# Patient Record
Sex: Female | Born: 1975 | Race: White | Hispanic: No | Marital: Married | Smoking: Never smoker
Health system: Southern US, Community
[De-identification: ages and names within clinical notes are randomized; demographics above are authoritative.]

## PROBLEM LIST (undated history)

## (undated) DIAGNOSIS — E785 Hyperlipidemia, unspecified: Secondary | ICD-10-CM

## (undated) HISTORY — PX: TUBAL LIGATION: SHX77

## (undated) HISTORY — DX: Hyperlipidemia, unspecified: E78.5

---

## 2002-10-09 DIAGNOSIS — O24419 Gestational diabetes mellitus in pregnancy, unspecified control: Secondary | ICD-10-CM

## 2004-07-24 ENCOUNTER — Other Ambulatory Visit: Payer: Self-pay

## 2004-07-24 ENCOUNTER — Emergency Department: Payer: Self-pay | Admitting: Emergency Medicine

## 2004-07-24 ENCOUNTER — Observation Stay: Payer: Self-pay | Admitting: Internal Medicine

## 2004-07-27 ENCOUNTER — Ambulatory Visit: Payer: Self-pay | Admitting: Unknown Physician Specialty

## 2006-10-09 DIAGNOSIS — O24419 Gestational diabetes mellitus in pregnancy, unspecified control: Secondary | ICD-10-CM

## 2006-12-17 ENCOUNTER — Encounter: Payer: Self-pay | Admitting: Obstetrics and Gynecology

## 2006-12-24 ENCOUNTER — Encounter: Payer: Self-pay | Admitting: Maternal & Fetal Medicine

## 2007-06-28 ENCOUNTER — Ambulatory Visit: Payer: Self-pay | Admitting: Obstetrics and Gynecology

## 2007-07-01 ENCOUNTER — Inpatient Hospital Stay: Payer: Self-pay | Admitting: Obstetrics and Gynecology

## 2009-02-01 ENCOUNTER — Ambulatory Visit: Payer: Self-pay | Admitting: Obstetrics and Gynecology

## 2011-01-11 ENCOUNTER — Ambulatory Visit: Payer: Self-pay | Admitting: Internal Medicine

## 2016-01-20 ENCOUNTER — Encounter: Payer: Self-pay | Admitting: Obstetrics and Gynecology

## 2016-01-25 ENCOUNTER — Encounter: Payer: Self-pay | Admitting: Obstetrics and Gynecology

## 2016-01-25 ENCOUNTER — Ambulatory Visit (INDEPENDENT_AMBULATORY_CARE_PROVIDER_SITE_OTHER): Payer: BLUE CROSS/BLUE SHIELD | Admitting: Obstetrics and Gynecology

## 2016-01-25 VITALS — BP 136/84 | HR 83 | Ht 66.0 in | Wt 191.7 lb

## 2016-01-25 DIAGNOSIS — Z1239 Encounter for other screening for malignant neoplasm of breast: Secondary | ICD-10-CM

## 2016-01-25 DIAGNOSIS — Z01419 Encounter for gynecological examination (general) (routine) without abnormal findings: Secondary | ICD-10-CM | POA: Diagnosis not present

## 2016-01-25 DIAGNOSIS — R638 Other symptoms and signs concerning food and fluid intake: Secondary | ICD-10-CM

## 2016-01-25 DIAGNOSIS — Z98891 History of uterine scar from previous surgery: Secondary | ICD-10-CM

## 2016-01-25 DIAGNOSIS — N393 Stress incontinence (female) (male): Secondary | ICD-10-CM

## 2016-01-25 DIAGNOSIS — K5190 Ulcerative colitis, unspecified, without complications: Secondary | ICD-10-CM | POA: Diagnosis not present

## 2016-01-25 NOTE — Progress Notes (Signed)
Patient ID: Autumn Coffey, female   DOB: 1976-05-12, 40 y.o.   MRN: [id number] ANNUAL PREVENTATIVE CARE GYN  ENCOUNTER NOTE  Subjective:       Autumn Coffey is a 40 y.o. [id number] female here for a routine annual gynecologic exam.  Current complaints: 1.  none   No recent health care visits. Significant past history is notable for first pregnancy complicated by fourth degree tear. Subsequent deliveries were by elective C-section in order to avoid further perineal trauma. Patient reports normal bowel and bladder function. Patient is experiencing mild stress urinary incontinence where she occasionally will wear pads    Gynecologic History Patient's last menstrual period was 12/28/2015 (approximate). Contraception: tubal ligation Last Pap: 2014. Results were: normal Last mammogram: 2010. Results were: normal  Obstetric History OB History  Gravida Para Term Preterm AB SAB TAB Ectopic Multiple Living  # Outcome Date GA Lbr Len/2nd Weight Sex Delivery Anes PTL Lv  3 Term 2008   7 lb 2.1 oz (3.234 kg) M CS-LTranv   Y     Complications: GDM (gestational diabetes mellitus)  2 Term 2004   7 lb 1.9 oz (3.23 kg) M CS-LTranv   Y     Complications: GDM (gestational diabetes mellitus)  1 Term 2000   7 lb 2.1 oz (3.234 kg) M Vag-Spont   Y      Past Medical History  Diagnosis Date  . Hyperlipemia     Past Surgical History  Procedure Laterality Date  . Cesarean section    . Tubal ligation      No current outpatient prescriptions on file prior to visit.   No current facility-administered medications on file prior to visit.    No Known Allergies  Social History   Social History  . Marital Status: Married    Spouse Name: N/A  . Number of Children: N/A  . Years of Education: N/A   Occupational History  . Not on file.   Social History Main Topics  . Smoking status: Never Smoker   . Smokeless tobacco: Not on file  . Alcohol Use: No  . Drug Use: No  .  Sexual Activity: Yes    Birth Control/ Protection: Surgical   Other Topics Concern  . Not on file   Social History Narrative  . No narrative on file    Family History  Problem Relation Age of Onset  . Diabetes Mother   . Diabetes Father   . Diabetes Brother   . Cancer Neg Hx   . Heart disease Neg Hx     The following portions of the patient's history were reviewed and updated as appropriate: allergies, current medications, past family history, past medical history, past social history, past surgical history and problem list.  Review of Systems ROS Review of Systems - General ROS: negative for - chills, fatigue, fever, hot flashes, night sweats, weight gain or weight loss Psychological ROS: negative for - anxiety, decreased libido, depression, mood swings, physical abuse or sexual abuse Ophthalmic ROS: negative for - blurry vision, eye pain or loss of vision ENT ROS: negative for - headaches, hearing change, visual changes or vocal changes Allergy and Immunology ROS: negative for - hives, itchy/watery eyes or seasonal allergies Hematological and Lymphatic ROS: negative for - bleeding problems, bruising, swollen lymph nodes or weight loss Endocrine ROS: negative for - galactorrhea, hair pattern changes, hot flashes, malaise/lethargy, mood swings, palpitations, polydipsia/polyuria, skin  changes, temperature intolerance or unexpected weight changes Breast ROS: negative for - new or changing breast lumps or nipple discharge Respiratory ROS: negative for - cough or shortness of breath Cardiovascular ROS: negative for - chest pain, irregular heartbeat, palpitations or shortness of breath Gastrointestinal ROS: no abdominal pain, change in bowel habits, or black or bloody stools Genito-Urinary ROS: no dysuria, trouble voiding, or hematuria Musculoskeletal ROS: negative for - joint pain or joint stiffness Neurological ROS: negative for - bowel and bladder control changes Dermatological  ROS: negative for rash and skin lesion changes   Objective:   BP 136/84 mmHg  Pulse 83  Ht 5\' 6"  (1.676 m)  Wt 191 lb 11.2 oz (86.955 kg)  BMI 30.96 kg/m2  LMP 12/28/2015 (Approximate) CONSTITUTIONAL: Well-developed, well-nourished female in no acute distress.  PSYCHIATRIC: Normal mood and affect. Normal behavior. Normal judgment and thought content. NEUROLGIC: Alert and oriented to person, place, and time. Normal muscle tone coordination. No cranial nerve deficit noted. HENT:  Normocephalic, atraumatic, External right and left ear normal. Oropharynx is clear and moist EYES: Conjunctivae and EOM are normal. Pupils are equal, round, and reactive to light. No scleral icterus.  NECK: Normal range of motion, supple, no masses.  Normal thyroid.  SKIN: Skin is warm and dry. No rash noted. Not diaphoretic. No erythema. No pallor. CARDIOVASCULAR: Normal heart rate noted, regular rhythm, no murmur. RESPIRATORY: Clear to auscultation bilaterally. Effort and breath sounds normal, no problems with respiration noted. BREASTS: Symmetric in size. No masses, skin changes, nipple drainage, or lymphadenopathy. ABDOMEN: Soft, normal bowel sounds, no distention noted.  No tenderness, rebound or guarding. Well-healed Pfannenstiel incisions BLADDER: Normal PELVIC:  External Genitalia: Normal  BUS: Normal  Vagina: Normal estrogen effect  Cervix: Normal; no cervical motion tenderness  Uterus: Normal; midplane, mobile, nontender, normal size and shape  Adnexa: Normal  RV: Rectal sphincter has good tone but is thinned, External Exam NormaI and No Rectal Masses  MUSCULOSKELETAL: Normal range of motion. No tenderness.  No cyanosis, clubbing, or edema.  2+ distal pulses. LYMPHATIC: No Axillary, Supraclavicular, or Inguinal Adenopathy.    Assessment:   Annual gynecologic examination 40 y.o. Contraception: tubal ligation bmi-30 SUI, mild, not desiring intervention  Plan:  Pap: Pap Co Test Mammogram:  Ordered Stool Guaiac Testing:  Not Indicated Labs: vit d lipid a1c fbs lipid Routine preventative health maintenance measures emphasized: Exercise/Diet/Weight control, Tobacco Warnings and Alcohol/Substance use risks Timed voiding, Kegel exercises, encouraged to minimize stress incontinence. Physical therapy consultation suggested Return to Clinic - 1 [address]   Crystal Goose Creek LakeMiller, [place]  Herold HarmsMartin A Brand Siever, MD  Note: This dictation was prepared with Dragon dictation along with smaller phrase technology. Any transcriptional errors that result from this process are unintentional.

## 2016-01-25 NOTE — Patient Instructions (Signed)
1. Pap smear is done. 2. Mammogram is ordered. 3. Continue with healthy eating and exercise with weight loss goal of 10 pounds in 1 year 4. Continue self breast exam monthly 5. Return in 1 year for annual exam

## 2016-01-26 DIAGNOSIS — N393 Stress incontinence (female) (male): Secondary | ICD-10-CM | POA: Insufficient documentation

## 2016-01-28 LAB — PAP IG AND HPV HIGH-RISK
HPV, HIGH-RISK: NEGATIVE
PAP SMEAR COMMENT: 0

## 2016-01-31 ENCOUNTER — Other Ambulatory Visit: Payer: BLUE CROSS/BLUE SHIELD

## 2016-01-31 ENCOUNTER — Ambulatory Visit
Admission: RE | Admit: 2016-01-31 | Discharge: 2016-01-31 | Disposition: A | Discharge status: Home / Self Care | Payer: BLUE CROSS/BLUE SHIELD | Source: Ambulatory Visit | Attending: Obstetrics and Gynecology | Admitting: Obstetrics and Gynecology

## 2016-01-31 DIAGNOSIS — Z1231 Encounter for screening mammogram for malignant neoplasm of breast: Secondary | ICD-10-CM | POA: Diagnosis present

## 2016-01-31 DIAGNOSIS — Z1239 Encounter for other screening for malignant neoplasm of breast: Secondary | ICD-10-CM

## 2016-02-01 LAB — VITAMIN D 25 HYDROXY (VIT D DEFICIENCY, FRACTURES): Vit D, 25-Hydroxy: 19.3 ng/mL — ABNORMAL LOW (ref 30.0–100.0)

## 2016-02-01 LAB — LIPID PANEL
CHOL/HDL RATIO: 6 ratio — AB (ref 0.0–4.4)
CHOLESTEROL TOTAL: 241 mg/dL — AB (ref 100–199)
HDL: 40 mg/dL (ref >39)
LDL CALC: 171 mg/dL — AB (ref 0–99)
TRIGLYCERIDES: 148 mg/dL (ref 0–149)
VLDL CHOLESTEROL CAL: 30 mg/dL (ref 5–40)

## 2016-02-01 LAB — GLUCOSE, RANDOM: GLUCOSE: 125 mg/dL — AB (ref 65–99)

## 2016-02-01 LAB — TSH: TSH: 3.94 u[IU]/mL (ref 0.450–4.500)

## 2016-02-01 LAB — HEMOGLOBIN A1C
Est. average glucose Bld gHb Est-mCnc: 140 mg/dL
Hgb A1c MFr Bld: 6.5 % — ABNORMAL HIGH (ref 4.8–5.6)

## 2016-02-03 ENCOUNTER — Telehealth: Payer: Self-pay

## 2016-02-03 DIAGNOSIS — E119 Type 2 diabetes mellitus without complications: Secondary | ICD-10-CM

## 2016-02-03 DIAGNOSIS — E785 Hyperlipidemia, unspecified: Secondary | ICD-10-CM

## 2016-02-03 NOTE — Telephone Encounter (Signed)
-----   Message from Herold HarmsMartin A Defrancesco, MD sent at 02/02/2016  8:45 AM EDT ----- Please notify - Abnormal Labs Fasting blood sugar and hemoglobin A1c are consistent with type 2 diabetes mellitus. Lipid panel is notable for elevated cholesterol, LDL cholesterol, and triglycerides. Recommended referral to internal medicine for further management planning and intervention Vitamin D level is low; recommend 1000 units vitamin D daily TSH is normal

## 2016-02-03 NOTE — Telephone Encounter (Signed)
Pt aware. Pt did not have pcp. Will refer.

## 2017-01-30 ENCOUNTER — Encounter: Payer: BLUE CROSS/BLUE SHIELD | Admitting: Obstetrics and Gynecology

## 2017-11-28 ENCOUNTER — Other Ambulatory Visit: Payer: Self-pay | Admitting: Nurse Practitioner

## 2017-11-28 DIAGNOSIS — Z1231 Encounter for screening mammogram for malignant neoplasm of breast: Secondary | ICD-10-CM

## 2017-12-27 ENCOUNTER — Ambulatory Visit
Admission: RE | Admit: 2017-12-27 | Discharge: 2017-12-27 | Disposition: A | Discharge status: Home / Self Care | Payer: BC Managed Care – PPO | Source: Ambulatory Visit | Attending: Nurse Practitioner | Admitting: Nurse Practitioner

## 2017-12-27 DIAGNOSIS — Z1231 Encounter for screening mammogram for malignant neoplasm of breast: Secondary | ICD-10-CM | POA: Diagnosis present

## 2019-06-18 ENCOUNTER — Other Ambulatory Visit: Payer: Self-pay | Admitting: Nurse Practitioner

## 2019-06-18 DIAGNOSIS — Z1231 Encounter for screening mammogram for malignant neoplasm of breast: Secondary | ICD-10-CM

## 2019-07-23 ENCOUNTER — Ambulatory Visit
Admission: RE | Admit: 2019-07-23 | Discharge: 2019-07-23 | Disposition: A | Discharge status: Home / Self Care | Payer: BC Managed Care – PPO | Source: Ambulatory Visit | Attending: Nurse Practitioner | Admitting: Nurse Practitioner

## 2019-07-23 DIAGNOSIS — Z1231 Encounter for screening mammogram for malignant neoplasm of breast: Secondary | ICD-10-CM | POA: Diagnosis present

## 2020-07-06 ENCOUNTER — Other Ambulatory Visit: Payer: Self-pay | Admitting: Nurse Practitioner

## 2020-07-06 DIAGNOSIS — Z1231 Encounter for screening mammogram for malignant neoplasm of breast: Secondary | ICD-10-CM

## 2020-07-26 ENCOUNTER — Ambulatory Visit
Admission: RE | Admit: 2020-07-26 | Discharge: 2020-07-26 | Disposition: A | Discharge status: Home / Self Care | Payer: BC Managed Care – PPO | Source: Ambulatory Visit | Attending: Nurse Practitioner | Admitting: Nurse Practitioner

## 2020-07-26 ENCOUNTER — Other Ambulatory Visit: Payer: Self-pay

## 2020-07-26 DIAGNOSIS — Z1231 Encounter for screening mammogram for malignant neoplasm of breast: Secondary | ICD-10-CM

## 2020-08-03 ENCOUNTER — Other Ambulatory Visit: Payer: Self-pay | Admitting: Nurse Practitioner

## 2020-08-03 DIAGNOSIS — N6489 Other specified disorders of breast: Secondary | ICD-10-CM

## 2020-08-03 DIAGNOSIS — R928 Other abnormal and inconclusive findings on diagnostic imaging of breast: Secondary | ICD-10-CM

## 2020-08-04 ENCOUNTER — Ambulatory Visit
Admission: RE | Admit: 2020-08-04 | Discharge: 2020-08-04 | Disposition: A | Discharge status: Home / Self Care | Payer: BC Managed Care – PPO | Source: Ambulatory Visit | Attending: Nurse Practitioner | Admitting: Nurse Practitioner

## 2020-08-04 ENCOUNTER — Other Ambulatory Visit: Payer: Self-pay

## 2020-08-04 DIAGNOSIS — R928 Other abnormal and inconclusive findings on diagnostic imaging of breast: Secondary | ICD-10-CM

## 2020-08-04 DIAGNOSIS — N6489 Other specified disorders of breast: Secondary | ICD-10-CM

## 2021-07-15 ENCOUNTER — Other Ambulatory Visit: Payer: Self-pay | Admitting: Nurse Practitioner

## 2021-07-15 DIAGNOSIS — Z1231 Encounter for screening mammogram for malignant neoplasm of breast: Secondary | ICD-10-CM

## 2021-08-18 ENCOUNTER — Other Ambulatory Visit: Payer: Self-pay

## 2021-08-18 ENCOUNTER — Ambulatory Visit
Admission: RE | Admit: 2021-08-18 | Discharge: 2021-08-18 | Disposition: A | Discharge status: Home / Self Care | Payer: BC Managed Care – PPO | Source: Ambulatory Visit | Attending: Nurse Practitioner | Admitting: Nurse Practitioner

## 2021-08-18 DIAGNOSIS — Z1231 Encounter for screening mammogram for malignant neoplasm of breast: Secondary | ICD-10-CM | POA: Diagnosis present

## 2022-03-19 IMAGING — MG MM DIGITAL SCREENING BILAT W/ TOMO AND CAD
6 of 10 series · 6 of 30 positions shown · non-contrast
Comparison: Previous exam(s).

CLINICAL DATA: Screening.

EXAM:
DIGITAL SCREENING BILATERAL MAMMOGRAM WITH TOMOSYNTHESIS AND CAD
TECHNIQUE: Bilateral screening digital craniocaudal and mediolateral oblique
mammograms were obtained. Bilateral screening digital breast
tomosynthesis was performed. The images were evaluated with
computer-aided detection.

[R CC synth-2D (1 of 2)]
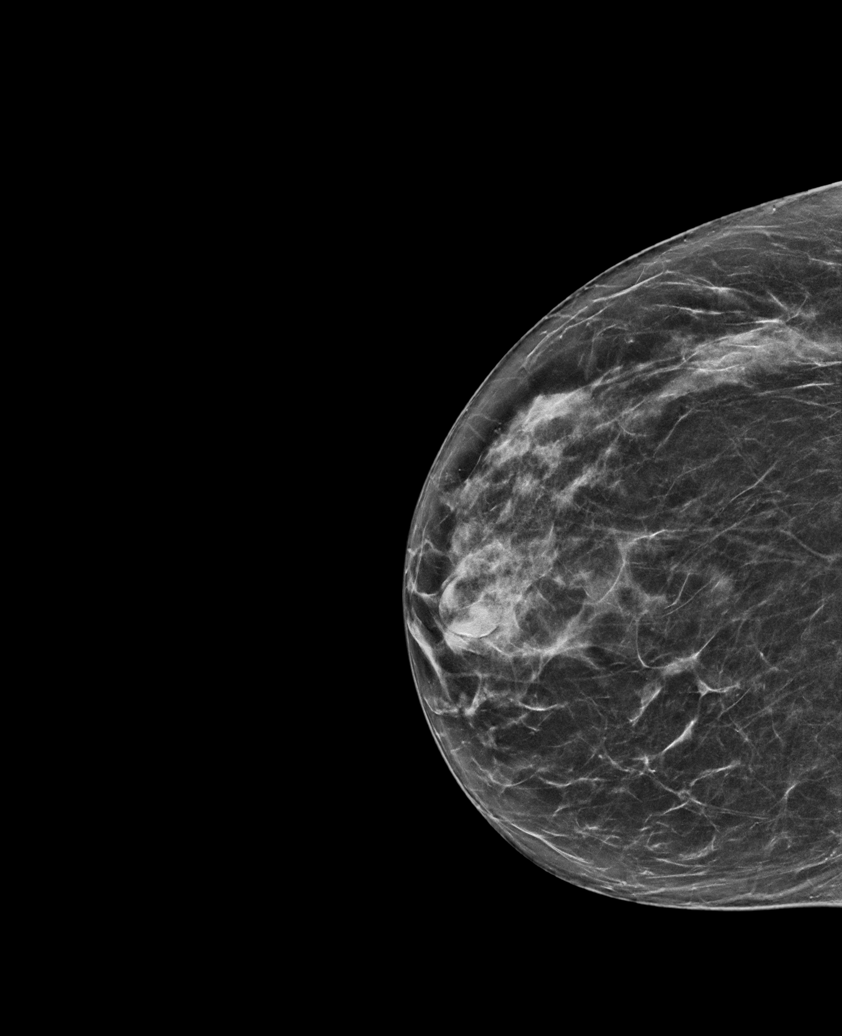

[R CC synth-2D (2 of 2)]
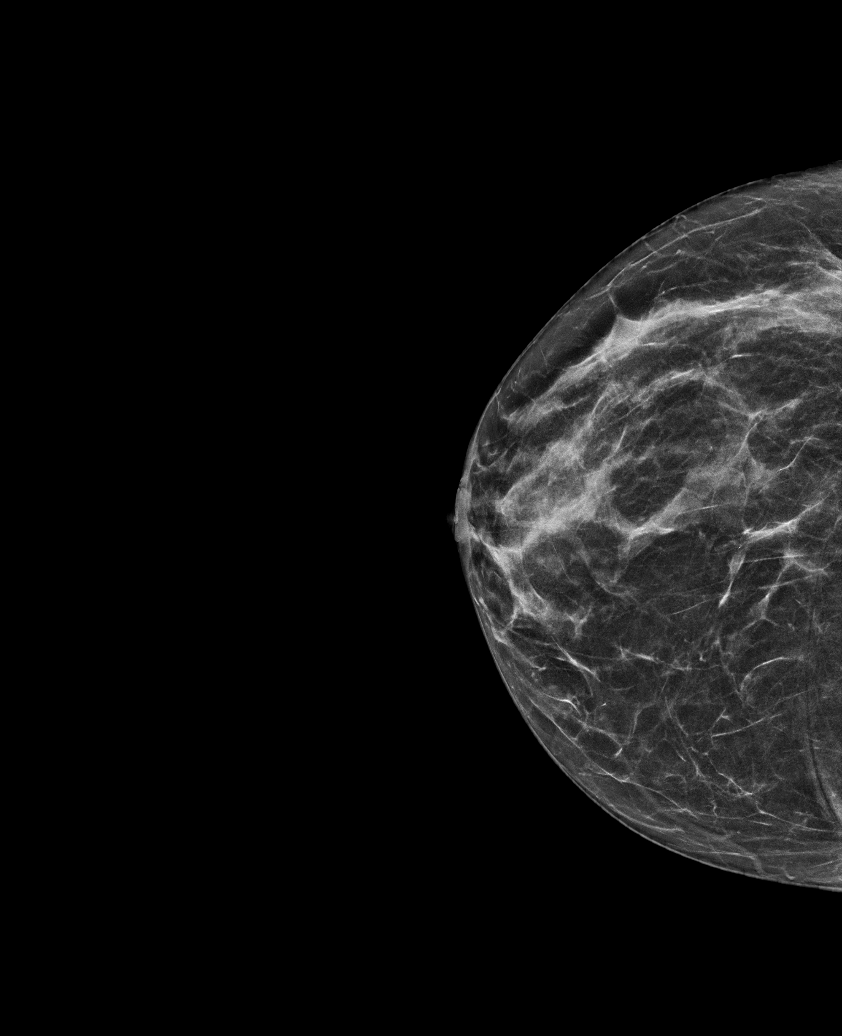

[L MLO synth-2D]
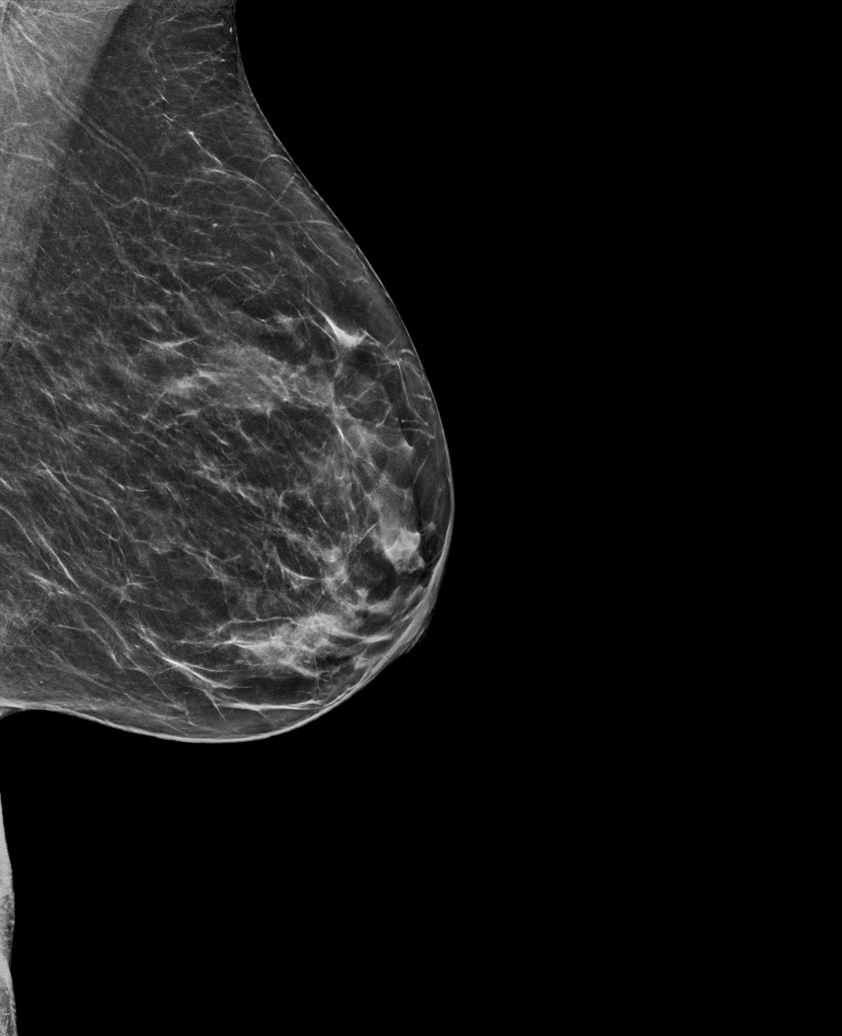

[R MLO synth-2D]
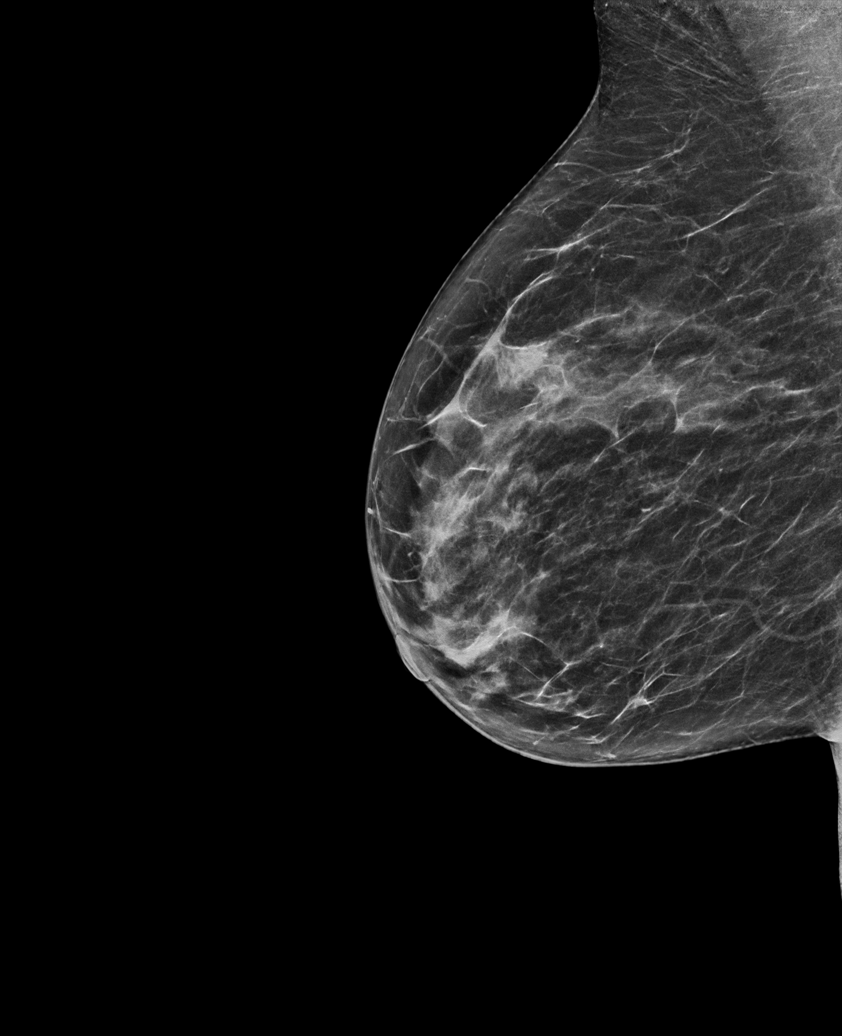

[L CC synth-2D]
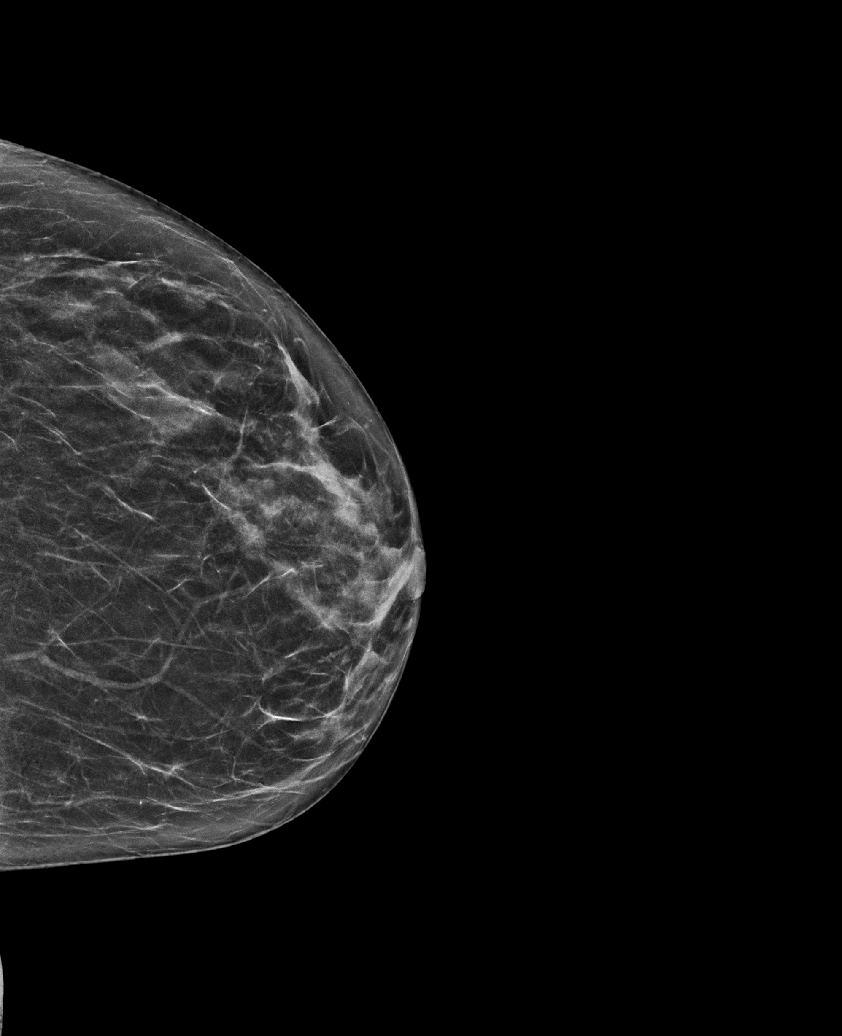

[R MLO tomo · tomo slice 33/65.0]
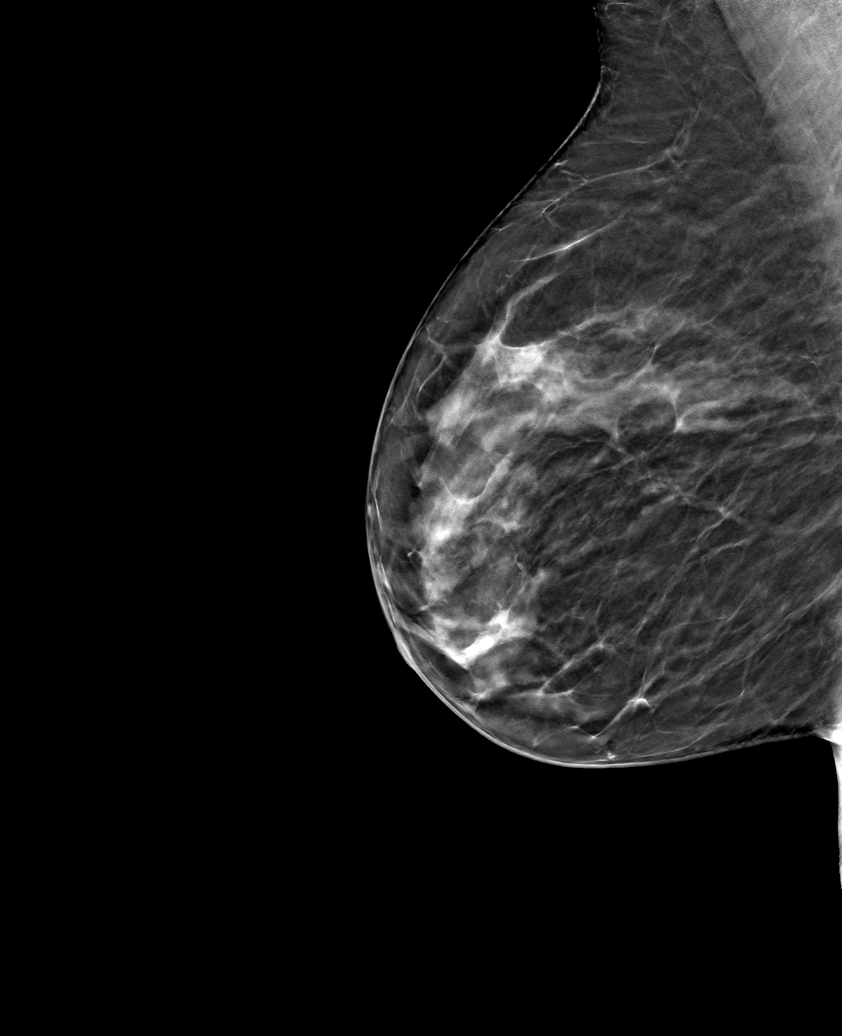

[6 of 30 positions shown; findings below may reference images not displayed]

ACR Breast Density Category c: The breast tissue is heterogeneously
dense, which may obscure small masses.
FINDINGS: There are no findings suspicious for malignancy.
IMPRESSION: No mammographic evidence of malignancy. A result letter of this
screening mammogram will be mailed directly to the patient.

RECOMMENDATION:
Screening mammogram in one year. (Code:Q3-W-BC3)

BI-RADS CATEGORY  1: Negative.

## 2022-08-08 ENCOUNTER — Other Ambulatory Visit: Payer: Self-pay | Admitting: Nurse Practitioner

## 2022-08-08 DIAGNOSIS — Z1231 Encounter for screening mammogram for malignant neoplasm of breast: Secondary | ICD-10-CM

## 2022-10-04 ENCOUNTER — Ambulatory Visit
Admission: RE | Admit: 2022-10-04 | Discharge: 2022-10-04 | Disposition: A | Discharge status: Home / Self Care | Payer: BC Managed Care – PPO | Source: Ambulatory Visit | Attending: Nurse Practitioner | Admitting: Nurse Practitioner

## 2022-10-04 DIAGNOSIS — Z1231 Encounter for screening mammogram for malignant neoplasm of breast: Secondary | ICD-10-CM | POA: Insufficient documentation

## 2023-08-29 ENCOUNTER — Other Ambulatory Visit: Payer: Self-pay | Admitting: Nurse Practitioner

## 2023-08-29 DIAGNOSIS — Z1231 Encounter for screening mammogram for malignant neoplasm of breast: Secondary | ICD-10-CM

## 2023-10-09 ENCOUNTER — Ambulatory Visit
Admission: RE | Admit: 2023-10-09 | Discharge: 2023-10-09 | Disposition: A | Discharge status: Home / Self Care | Payer: BC Managed Care – PPO | Source: Ambulatory Visit | Attending: Nurse Practitioner | Admitting: Nurse Practitioner

## 2023-10-09 DIAGNOSIS — Z1231 Encounter for screening mammogram for malignant neoplasm of breast: Secondary | ICD-10-CM | POA: Diagnosis present

## 2024-11-12 ENCOUNTER — Other Ambulatory Visit: Payer: Self-pay | Admitting: Nurse Practitioner

## 2024-11-12 DIAGNOSIS — Z1231 Encounter for screening mammogram for malignant neoplasm of breast: Secondary | ICD-10-CM
